# Patient Record
Sex: Female | Born: 1935 | Race: Black or African American | Hispanic: No | Marital: Single | Smoking: Current every day smoker
Health system: Southern US, Community
[De-identification: ages and names within clinical notes are randomized; demographics above are authoritative.]

## PROBLEM LIST (undated history)

## (undated) DIAGNOSIS — F03918 Unspecified dementia, unspecified severity, with other behavioral disturbance: Secondary | ICD-10-CM

---

## 2014-12-29 ENCOUNTER — Encounter (HOSPITAL_COMMUNITY): Payer: Self-pay | Admitting: Emergency Medicine

## 2014-12-29 ENCOUNTER — Emergency Department (HOSPITAL_COMMUNITY): Payer: Self-pay

## 2014-12-29 ENCOUNTER — Emergency Department (HOSPITAL_COMMUNITY)
Admission: EM | Admit: 2014-12-29 | Discharge: 2014-12-31 | Payer: Self-pay | Attending: Emergency Medicine | Admitting: Emergency Medicine

## 2014-12-29 DIAGNOSIS — R4182 Altered mental status, unspecified: Secondary | ICD-10-CM | POA: Insufficient documentation

## 2014-12-29 DIAGNOSIS — F0390 Unspecified dementia without behavioral disturbance: Secondary | ICD-10-CM | POA: Diagnosis present

## 2014-12-29 LAB — COMPREHENSIVE METABOLIC PANEL
ALT: 11 U/L (ref 0–35)
AST: 19 U/L (ref 0–37)
Albumin: 4.2 g/dL (ref 3.5–5.2)
Alkaline Phosphatase: 89 U/L (ref 39–117)
Anion gap: 10 (ref 5–15)
BUN: 12 mg/dL (ref 6–23)
CO2: 25 mmol/L (ref 19–32)
Calcium: 9.5 mg/dL (ref 8.4–10.5)
Chloride: 106 mmol/L (ref 96–112)
Creatinine, Ser: 0.83 mg/dL (ref 0.50–1.10)
GFR calc Af Amer: 72 mL/min — ABNORMAL LOW (ref >90)
GFR calc non Af Amer: 62 mL/min — ABNORMAL LOW (ref >90)
Glucose, Bld: 78 mg/dL (ref 70–99)
Potassium: 3.3 mmol/L — ABNORMAL LOW (ref 3.5–5.1)
Sodium: 141 mmol/L (ref 135–145)
Total Bilirubin: 0.5 mg/dL (ref 0.3–1.2)
Total Protein: 7.9 g/dL (ref 6.0–8.3)

## 2014-12-29 LAB — CBC
HCT: 42.8 % (ref 36.0–46.0)
Hemoglobin: 13.8 g/dL (ref 12.0–15.0)
MCH: 30 pg (ref 26.0–34.0)
MCHC: 32.2 g/dL (ref 30.0–36.0)
MCV: 93 fL (ref 78.0–100.0)
Platelets: 250 10*3/uL (ref 150–400)
RBC: 4.6 MIL/uL (ref 3.87–5.11)
RDW: 15.9 % — ABNORMAL HIGH (ref 11.5–15.5)
WBC: 3.3 10*3/uL — ABNORMAL LOW (ref 4.0–10.5)

## 2014-12-29 LAB — URINALYSIS, ROUTINE W REFLEX MICROSCOPIC
Bilirubin Urine: NEGATIVE
Glucose, UA: NEGATIVE mg/dL
Hgb urine dipstick: NEGATIVE
Ketones, ur: NEGATIVE mg/dL
Leukocytes, UA: NEGATIVE
Nitrite: NEGATIVE
Protein, ur: NEGATIVE mg/dL
Specific Gravity, Urine: 1.003 — ABNORMAL LOW (ref 1.005–1.030)
Urobilinogen, UA: 0.2 mg/dL (ref 0.0–1.0)
pH: 5.5 (ref 5.0–8.0)

## 2014-12-29 LAB — RAPID URINE DRUG SCREEN, HOSP PERFORMED
Amphetamines: NOT DETECTED
Barbiturates: NOT DETECTED
Benzodiazepines: NOT DETECTED
Cocaine: NOT DETECTED
Opiates: NOT DETECTED
Tetrahydrocannabinol: NOT DETECTED

## 2014-12-29 LAB — ACETAMINOPHEN LEVEL: Acetaminophen (Tylenol), Serum: <10 ug/mL — ABNORMAL LOW (ref 10–30)

## 2014-12-29 LAB — SALICYLATE LEVEL: Salicylate Lvl: <4 mg/dL (ref 2.8–20.0)

## 2014-12-29 LAB — ETHANOL: Alcohol, Ethyl (B): 6 mg/dL (ref 0–9)

## 2014-12-29 MED ORDER — LORAZEPAM 1 MG PO TABS
0.0000 mg | ORAL_TABLET | Freq: Two times a day (BID) | ORAL | Status: DC
  Administered 2014-12-29: 2 mg via ORAL

## 2014-12-29 MED ORDER — ACETAMINOPHEN 325 MG PO TABS: 650.0000 mg | ORAL_TABLET | ORAL | Status: DC | PRN

## 2014-12-29 MED ORDER — IBUPROFEN 200 MG PO TABS: 600.0000 mg | ORAL_TABLET | Freq: Three times a day (TID) | ORAL | Status: DC | PRN

## 2014-12-29 MED ORDER — LORAZEPAM 1 MG PO TABS
0.0000 mg | ORAL_TABLET | Freq: Four times a day (QID) | ORAL | Status: DC
  Administered 2014-12-31: 1 mg via ORAL
  Filled 2014-12-29: qty 2
  Filled 2014-12-29: qty 1

## 2014-12-29 NOTE — ED Notes (Signed)
Unable to get bllod at this time incorrect name and birth date in computer

## 2014-12-29 NOTE — ED Notes (Signed)
Patient medicated with Ativan per CIWA scale.  She repeatedly says she cannot go to sleep, and that she doesn't want to.  Anxiety level is increasing.  She says she's having a cramp in her foot that usually goes away after a beer.  Sitter remains at bedside.

## 2014-12-29 NOTE — ED Notes (Signed)
Pt reports that she drinks beer everyday but when asked how much she drinks, pt replied that she "drinks a lot". Pt was unsure about how much she actually drinks. When asked how she gets her alcohol she reported that she sends other people to get it, her husband gets it, and she sometimes goes and gets it herself. Reports that she stays home most days and prepares dinner, drinks her beer, and watches tv while her husband works all day.
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
## 2014-12-29 NOTE — ED Notes (Signed)
Patient and belongings have been wanded by security 
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
## 2014-12-29 NOTE — ED Provider Notes (Signed)
CSN: [id number]     Arrival date & time 12/29/14  1127 History   First MD Initiated Contact with Patient 12/29/14 1507     Chief Complaint  Patient presents with  . IVC'd      (Consider location/radiation/quality/duration/timing/severity/associated sxs/prior Treatment) HPI Comments: Patient here under involuntary commitment due to not being safe at home and suspect dementia. According to the IVC paperwork, patient has been wandering and speaking to her dead husband. No prior history of dementia. There was reported history of her becoming aggressive with her family members. She was brought here via [employer]. She denies any of the above issues. States she feels at her baseline. Denies any recent medication changes. Patient is wondering why she is here  The history is provided by the patient. The history is limited by the condition of the patient.    History reviewed. No pertinent past medical history. History reviewed. No pertinent past surgical history. No family history on file. History  Substance Use Topics  . Smoking status: Not on file  . Smokeless tobacco: Not on file  . Alcohol Use: Yes   OB History    No data available     Review of Systems  Unable to perform ROS     Allergies  Review of patient's allergies indicates no known allergies.  Home Medications   Prior to Admission medications   Not on File   BP 154/75 mmHg  Pulse 77  Temp(Src) 98.6 F (37 C) (Oral)  Resp 16  SpO2 100% Physical Exam  Constitutional: She is oriented to person, place, and time. She appears well-developed and well-nourished.  Non-toxic appearance. No distress.  HENT:  Head: Normocephalic and atraumatic.  Eyes: Conjunctivae, EOM and lids are normal. Pupils are equal, round, and reactive to light.  Neck: Normal range of motion. Neck supple. No tracheal deviation present. No thyroid mass present.  Cardiovascular: Normal rate, regular rhythm and normal heart sounds.  Exam reveals no gallop.    No murmur heard. Pulmonary/Chest: Effort normal and breath sounds normal. No stridor. No respiratory distress. She has no decreased breath sounds. She has no wheezes. She has no rhonchi. She has no rales.  Abdominal: Soft. Normal appearance and bowel sounds are normal. She exhibits no distension. There is no tenderness. There is no rebound and no CVA tenderness.  Musculoskeletal: Normal range of motion. She exhibits no edema or tenderness.  Neurological: She is alert and oriented to person, place, and time. She has normal strength. No cranial nerve deficit or sensory deficit. GCS eye subscore is 4. GCS verbal subscore is 5. GCS motor subscore is 6.  Skin: Skin is warm and dry. No abrasion and no rash noted.  Psychiatric: Her speech is normal. She is not agitated. Thought content is delusional. She exhibits a depressed mood. She expresses no suicidal plans and no homicidal plans.  Nursing note and vitals reviewed.   ED Course  Procedures (including critical care time) Labs Review Labs Reviewed  ACETAMINOPHEN LEVEL - Abnormal; Notable for the following:    Acetaminophen (Tylenol), Serum <10.0 (*)    All other components within normal limits  CBC - Abnormal; Notable for the following:    WBC 3.3 (*)    RDW 15.9 (*)    All other components within normal limits  COMPREHENSIVE METABOLIC PANEL - Abnormal; Notable for the following:    Potassium 3.3 (*)    GFR calc non Af Amer 62 (*)    GFR calc Af Amer 72 (*)  All other components within normal limits  URINALYSIS, ROUTINE W REFLEX MICROSCOPIC - Abnormal; Notable for the following:    APPearance CLOUDY (*)    Specific Gravity, Urine 1.003 (*)    All other components within normal limits  ETHANOL  SALICYLATE LEVEL  URINE RAPID DRUG SCREEN (HOSP PERFORMED)    Imaging Review No results found.   EKG Interpretation None      MDM   Final diagnoses:  None   Patient medically cleared and will be placed per psychiatry  service    Lorre NickAnthony Amogh Komatsu, MD 01/01/15 [id number]

## 2014-12-30 ENCOUNTER — Emergency Department (HOSPITAL_COMMUNITY): Payer: Self-pay

## 2014-12-30 DIAGNOSIS — F0390 Unspecified dementia without behavioral disturbance: Secondary | ICD-10-CM | POA: Diagnosis present

## 2014-12-30 NOTE — ED Notes (Signed)
Pt states she stays with her husband at home.  When pt asked her age, pt states that she is 81 yo.Pt states that she usually buys a 12 pack of beer and drinks about 2 cans a night when she wants to sleep, but states she does not drink every night. Pt states if 2 cans don't help her to sleep she drinks another can and sometimes she has to sneak in the bathroom and drink so that her husband does not see her.

## 2014-12-30 NOTE — Progress Notes (Addendum)
[facility]  [facility]  [place]   [facility]  [place]  [facility]

## 2014-12-30 NOTE — Progress Notes (Signed)
CSW referred the pt to the following hospital to try and obtain placement for hospitalization :  [facility] [address]  Airis Barbee Esmond CamperWhitaker, LCSWA [phone] ED CSW 12/30/2014 10:48 PM
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
## 2014-12-30 NOTE — ED Notes (Signed)
Psych MD/NP at bedside. 

## 2014-12-30 NOTE — Progress Notes (Signed)
Pt observed sleeping throughout the day.  Pt finally awoke and pleasant Eating her lunch.  Pt informed Cm she recalled being seeing by Dr Shanna CiscoA Blount and a "white guy here but I don't know his name"  ([facility] Pt spoke with Cm about her family, she is baby of the family and her dancing.  Pt states she still tap dances

## 2014-12-31 DIAGNOSIS — F0390 Unspecified dementia without behavioral disturbance: Secondary | ICD-10-CM

## 2014-12-31 NOTE — Progress Notes (Signed)
[facility] Dr[facility] Lowanda FosterBeverly Jones. Nurse to call reprot to [phone]. CSW left message with admitting to confirm time of transfer and receiving of the EKG as requested. Pt under IVC, and waiting to be served papers.   Olga CoasterKristen Demauri Advincula, LCSW  Clinical Social Work  [facility] [phone]
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
## 2014-12-31 NOTE — ED Notes (Signed)
Chest x-ray and EKG faxed to [facility], per request of Marcus.

## 2014-12-31 NOTE — BH Assessment (Signed)
[facility] Assessment Progress Note   Clinician received a call from DoyleJennifer at [facility].  Dr. Lowanda FosterBeverly Jones has accepted patient for admission.  Nurse call report number is [phone].  Victorino DikeJennifer did request a copy of the EKG.  Clinician asked Carollee HerterShannon, RN if she would please fax it over, she said she would.  Patient needs IVC paperwork to be reviewed.

## 2020-06-17 ENCOUNTER — Emergency Department (HOSPITAL_COMMUNITY): Payer: Medicare Other

## 2020-06-17 ENCOUNTER — Emergency Department (HOSPITAL_COMMUNITY)
Admission: EM | Admit: 2020-06-17 | Discharge: 2020-06-18 | Disposition: A | Discharge status: Home / Self Care | Payer: Medicare Other | Attending: Emergency Medicine | Admitting: Emergency Medicine

## 2020-06-17 ENCOUNTER — Other Ambulatory Visit: Payer: Self-pay

## 2020-06-17 DIAGNOSIS — W19XXXA Unspecified fall, initial encounter: Secondary | ICD-10-CM | POA: Diagnosis not present

## 2020-06-17 DIAGNOSIS — Y929 Unspecified place or not applicable: Secondary | ICD-10-CM | POA: Diagnosis not present

## 2020-06-17 DIAGNOSIS — S0101XA Laceration without foreign body of scalp, initial encounter: Secondary | ICD-10-CM | POA: Diagnosis present

## 2020-06-17 DIAGNOSIS — F0390 Unspecified dementia without behavioral disturbance: Secondary | ICD-10-CM | POA: Diagnosis not present

## 2020-06-17 DIAGNOSIS — Y939 Activity, unspecified: Secondary | ICD-10-CM | POA: Diagnosis not present

## 2020-06-17 DIAGNOSIS — Y999 Unspecified external cause status: Secondary | ICD-10-CM | POA: Insufficient documentation

## 2020-06-17 NOTE — Discharge Instructions (Addendum)
[facility] out in 7 to 10 days 

## 2020-06-17 NOTE — ED Provider Notes (Signed)
[facility]-EMERGENCY DEPT Provider Note   CSN: [id number] Arrival date & time: 06/17/20  2008     History No chief complaint on file.   Lacey Rogers is a 86 y.o. female.  86 year old female with history dementia here after unwitnessed fall.  Is found to have a small laceration to her occiput.  She apparently is at her mental baseline.  She herself complains of no pain at this time.  Transported by EMS for further management        No past medical history on file.  Patient Active Problem List   Diagnosis Date Noted  . Dementia (HCC) 12/30/2014    No past surgical history on file.   OB History   No obstetric history on file.     No family history on file.  Social History   Tobacco Use  . Smoking status: Not on file  Substance Use Topics  . Alcohol use: Yes  . Drug use: Not on file    Home Medications Prior to Admission medications   Not on File    Allergies    Patient has no known allergies.  Review of Systems   Review of Systems  Unable to perform ROS: Dementia    Physical Exam Updated Vital Signs BP 129/66 (BP Location: Right Arm)   Pulse 65   Temp 98.9 F (37.2 C) (Oral)   Resp 16   Ht 1.575 m (5\' 2" )   Wt 59 kg   SpO2 100%   BMI 23.78 kg/m   Physical Exam Vitals and nursing note reviewed.  Constitutional:      General: She is not in acute distress.    Appearance: Normal appearance. She is well-developed. She is not toxic-appearing.  HENT:     Head: Normocephalic and atraumatic.  Eyes:     General: Lids are normal.     Conjunctiva/sclera: Conjunctivae normal.     Pupils: Pupils are equal, round, and reactive to light.  Neck:     Thyroid: No thyroid mass.     Trachea: No tracheal deviation.  Cardiovascular:     Rate and Rhythm: Normal rate and regular rhythm.     Heart sounds: Normal heart sounds. No murmur heard.  No gallop.   Pulmonary:     Effort: Pulmonary effort is normal. No respiratory distress.      Breath sounds: Normal breath sounds. No stridor. No decreased breath sounds, wheezing, rhonchi or rales.  Abdominal:     General: Bowel sounds are normal. There is no distension.     Palpations: Abdomen is soft.     Tenderness: There is no abdominal tenderness. There is no rebound.  Musculoskeletal:        General: No tenderness. Normal range of motion.     Cervical back: Normal range of motion and neck supple.  Skin:    General: Skin is warm and dry.     Findings: No abrasion or rash.  Neurological:     General: No focal deficit present.     Mental Status: She is alert.     GCS: GCS eye subscore is 4. GCS verbal subscore is 5. GCS motor subscore is 6.     Cranial Nerves: No cranial nerve deficit.     Sensory: Sensation is intact. No sensory deficit.     Motor: Motor function is intact.  Psychiatric:        Attention and Perception: Attention normal.        Speech: Speech normal.  ED Results / Procedures / Treatments   Labs (all labs ordered are listed, but only abnormal results are displayed) Labs Reviewed - No data to display  EKG None  Radiology No results found.  Procedures Procedures (including critical care time)  Medications Ordered in ED Medications - No data to display  ED Course  I have reviewed the triage vital signs and the nursing notes.  Pertinent labs & imaging results that were available during my care of the patient were reviewed by me and considered in my medical decision making (see chart for details).    MDM Rules/Calculators/A&P                          LACERATION REPAIR Performed by: Toy Baker Authorized by: Toy Baker Consent: Verbal consent obtained. Risks and benefits: risks, benefits and alternatives were discussed Consent given by: patient Patient identity confirmed: provided demographic data Prepped and Draped in normal sterile fashion Wound explored  Laceration Location: Scalp  Laceration Length: 1 cm  No  Foreign Bodies seen or palpated  Anesthesia: local infiltration  Local anesthetic:     Irrigation method: syringe Amount of cleaning: standard  Skin closure: Staples  3 staples  Technique: Simple  Patient tolerance: Patient tolerated the procedure well with no immediate complications.  Head and C-spine CT negative.  Patient stable for return to her facility Final Clinical Impression(s) / ED Diagnoses Final diagnoses:  None    Rx / DC Orders ED Discharge Orders    None       Lorre Nick, MD 06/17/20 2238

## 2020-06-17 NOTE — ED Triage Notes (Signed)
Patient here via EMS from [facility], SNF, with c/o finding the patient sitting on the floor, fell backwards and hit here head.  C/O headache, 1" lac to the back of the head, bleeding controlled, occasionally has falls from an unsteady gait.  No blood thinners, wound is dressed with gauze, Hx of Dementia.  118/70,60,98% on ra.  DNR

## 2020-12-06 ENCOUNTER — Emergency Department (HOSPITAL_COMMUNITY)
Admission: EM | Admit: 2020-12-06 | Discharge: 2020-12-06 | Disposition: A | Discharge status: Home / Self Care | Payer: Medicare Other | Attending: Emergency Medicine | Admitting: Emergency Medicine

## 2020-12-06 ENCOUNTER — Other Ambulatory Visit: Payer: Self-pay

## 2020-12-06 ENCOUNTER — Emergency Department (HOSPITAL_COMMUNITY): Payer: Medicare Other

## 2020-12-06 ENCOUNTER — Encounter (HOSPITAL_COMMUNITY): Payer: Self-pay | Admitting: Emergency Medicine

## 2020-12-06 DIAGNOSIS — M79605 Pain in left leg: Secondary | ICD-10-CM | POA: Diagnosis not present

## 2020-12-06 DIAGNOSIS — F17200 Nicotine dependence, unspecified, uncomplicated: Secondary | ICD-10-CM | POA: Diagnosis not present

## 2020-12-06 DIAGNOSIS — F0390 Unspecified dementia without behavioral disturbance: Secondary | ICD-10-CM | POA: Diagnosis not present

## 2020-12-06 DIAGNOSIS — R52 Pain, unspecified: Secondary | ICD-10-CM

## 2020-12-06 MED ORDER — ACETAMINOPHEN 325 MG PO TABS
650.0000 mg | ORAL_TABLET | Freq: Once | ORAL | Status: AC
  Administered 2020-12-06: 650 mg via ORAL
  Filled 2020-12-06: qty 2

## 2020-12-06 NOTE — ED Triage Notes (Signed)
Pt arrived via EMS from [facility]. Pt has hx of dementia. Pt reports that she woke up with leg pain. Pt has swelling and pain to the left knee. Pt reports pain on palpation to her left foot.

## 2020-12-06 NOTE — Discharge Instructions (Addendum)
Today's evaluation has been reassuring. There is no evidence for a fracture or broken bone in your hip, knee, ankle.  If you develop new, or concerning changes return here for additional evaluation.  Please use Tylenol, diclofenac cream for pain control.

## 2020-12-06 NOTE — ED Notes (Signed)
[facility] called for transport.  

## 2020-12-06 NOTE — ED Provider Notes (Signed)
[facility]-EMERGENCY DEPT Provider Note   CSN: [id number] Arrival date & time: 12/06/20  2117     History Chief Complaint  Patient presents with  . Leg Pain    Lacey Rogers is a 87 y.o. female.  HPI Patient presents for left leg pain.  She arrives via EMS from a local memory care facility. History is obtained by the EMS providers.  The patient herself answers some questions directly others without consistency.  Seemingly the patient is typically ambulatory, active.  Today she complained of pain in her left leg.  No report of fall, trauma, and she was upright when this was discovered. Seemingly the pain was initially in the knee, and EMS providers the patient was hesitant to move the knee secondary to pain on transport.  The patient herself when asked where it hurts indicates that it is her ankle.  No report of fever, change in interactivity, or other new developments. Level five caveat secondary to dementia.     History reviewed. No pertinent past medical history.  Patient Active Problem List   Diagnosis Date Noted  . Dementia (HCC) 12/30/2014    History reviewed. No pertinent surgical history.   OB History   No obstetric history on file.     History reviewed. No pertinent family history.  Social History   Tobacco Use  . Smoking status: Current Every Day Smoker  . Smokeless tobacco: Never Used  Substance Use Topics  . Alcohol use: Yes    Home Medications Prior to Admission medications   Not on File    Allergies    Patient has no known allergies.  Review of Systems   Review of Systems  Unable to perform ROS: Dementia    Physical Exam Updated Vital Signs BP 112/79   Pulse 68   Temp 99.2 F (37.3 C) (Oral)   Resp 18   Ht 5\' 2"  (1.575 m)   Wt 54.4 kg   SpO2 99%   BMI 21.95 kg/m   Physical Exam Vitals and nursing note reviewed.  Constitutional:      General: She is not in acute distress.    Appearance: She is  well-developed and well-nourished.  HENT:     Head: Normocephalic and atraumatic.  Eyes:     Extraocular Movements: EOM normal.     Conjunctiva/sclera: Conjunctivae normal.  Cardiovascular:     Rate and Rhythm: Normal rate and regular rhythm.     Pulses: Normal pulses.  Pulmonary:     Effort: Pulmonary effort is normal. No respiratory distress.     Breath sounds: Normal breath sounds. No stridor.  Abdominal:     General: There is no distension.  Musculoskeletal:        General: No edema.       Legs:  Skin:    General: Skin is warm and dry.  Neurological:     Mental Status: She is alert and oriented to person, place, and time.     Cranial Nerves: No cranial nerve deficit.     Motor: Atrophy present.  Psychiatric:        Mood and Affect: Mood and affect normal.        Cognition and Memory: Cognition is impaired. Memory is impaired.     ED Results / Procedures / Treatments   Labs (all labs ordered are listed, but only abnormal results are displayed) Labs Reviewed - No data to display  EKG None  Radiology DG Ankle Complete Left  Result  Date: 12/06/2020 CLINICAL DATA:  Pain EXAM: LEFT ANKLE COMPLETE - 3+ VIEW COMPARISON:  None. FINDINGS: Moderate degenerative changes are noted of the ankle mortise. No acute displaced fracture. Vascular calcifications are noted. IMPRESSION: 1. No acute displaced fracture or dislocation. 2. Moderate degenerative changes. Electronically Signed   By: Katherine Mantle M.D.   On: 12/06/2020 22:22   DG Knee Complete 4 Views Left  Result Date: 12/06/2020 CLINICAL DATA:  Pain EXAM: LEFT KNEE - COMPLETE 4+ VIEW COMPARISON:  None. FINDINGS: There are advanced degenerative changes of the left knee, greatest within the lateral compartment. There is no acute fracture. There is osteopenia. Vascular calcifications are noted. IMPRESSION: 1. No acute osseous abnormality. 2. Advanced degenerative changes of the left knee. Electronically Signed   By:  Katherine Mantle M.D.   On: 12/06/2020 22:20   DG Hip Unilat With Pelvis 2-3 Views Left  Result Date: 12/06/2020 CLINICAL DATA:  Pain EXAM: DG HIP (WITH OR WITHOUT PELVIS) 2-3V LEFT COMPARISON:  None. FINDINGS: There are advanced degenerative changes of both hips, right greater than left. There are advanced degenerative changes of the lower lumbar spine. There is no acute displaced fracture. No dislocation. IMPRESSION: Advanced degenerative changes of both hips, right greater than left. Electronically Signed   By: Katherine Mantle M.D.   On: 12/06/2020 22:20    Procedures Procedures   Medications Ordered in ED Medications  acetaminophen (TYLENOL) tablet 650 mg (650 mg Oral Given 12/06/20 2137)    ED Course  I have reviewed the triage vital signs and the nursing notes.  Pertinent labs & imaging results that were available during my care of the patient were reviewed by me and considered in my medical decision making (see chart for details).  10:54 PM Patient sitting up, moving her legs freely, does not recall complaining of pain.  I reviewed the x-rays, no notable abnormalities, substantial degenerative changes. No current complaints of pain, no report of fall or trauma, low suspicion for occult fracture, though this is admittedly a possibility as the patient is demented. Patient is appropriate for return to her nursing facility with initiation of analgesics. Final Clinical Impression(s) / ED Diagnoses Final diagnoses:  Pain     Gerhard Munch, MD 12/06/20 2255

## 2021-10-31 ENCOUNTER — Emergency Department (HOSPITAL_COMMUNITY)
Admission: EM | Admit: 2021-10-31 | Discharge: 2021-10-31 | Disposition: A | Discharge status: Home / Self Care | Payer: Medicare Other | Attending: Emergency Medicine | Admitting: Emergency Medicine

## 2021-10-31 ENCOUNTER — Emergency Department (HOSPITAL_COMMUNITY): Payer: Medicare Other

## 2021-10-31 ENCOUNTER — Other Ambulatory Visit: Payer: Self-pay

## 2021-10-31 DIAGNOSIS — S0101XA Laceration without foreign body of scalp, initial encounter: Secondary | ICD-10-CM | POA: Diagnosis not present

## 2021-10-31 DIAGNOSIS — Z23 Encounter for immunization: Secondary | ICD-10-CM | POA: Diagnosis not present

## 2021-10-31 DIAGNOSIS — S0990XA Unspecified injury of head, initial encounter: Secondary | ICD-10-CM | POA: Diagnosis present

## 2021-10-31 DIAGNOSIS — I517 Cardiomegaly: Secondary | ICD-10-CM | POA: Diagnosis not present

## 2021-10-31 DIAGNOSIS — F0390 Unspecified dementia without behavioral disturbance: Secondary | ICD-10-CM | POA: Insufficient documentation

## 2021-10-31 DIAGNOSIS — W1839XA Other fall on same level, initial encounter: Secondary | ICD-10-CM | POA: Insufficient documentation

## 2021-10-31 DIAGNOSIS — W19XXXA Unspecified fall, initial encounter: Secondary | ICD-10-CM

## 2021-10-31 LAB — CBC WITH DIFFERENTIAL/PLATELET
Abs Immature Granulocytes: 0.03 10*3/uL (ref 0.00–0.07)
Basophils Absolute: 0 10*3/uL (ref 0.0–0.1)
Basophils Relative: 0 %
Eosinophils Absolute: 0.2 10*3/uL (ref 0.0–0.5)
Eosinophils Relative: 3 %
HCT: 39.6 % (ref 36.0–46.0)
Hemoglobin: 12.8 g/dL (ref 12.0–15.0)
Immature Granulocytes: 0 %
Lymphocytes Relative: 26 %
Lymphs Abs: 1.8 10*3/uL (ref 0.7–4.0)
MCH: 29.5 pg (ref 26.0–34.0)
MCHC: 32.3 g/dL (ref 30.0–36.0)
MCV: 91.2 fL (ref 80.0–100.0)
Monocytes Absolute: 0.5 10*3/uL (ref 0.1–1.0)
Monocytes Relative: 7 %
Neutro Abs: 4.3 10*3/uL (ref 1.7–7.7)
Neutrophils Relative %: 64 %
Platelets: 244 10*3/uL (ref 150–400)
RBC: 4.34 MIL/uL (ref 3.87–5.11)
RDW: 14.6 % (ref 11.5–15.5)
WBC: 6.9 10*3/uL (ref 4.0–10.5)
nRBC: 0 % (ref 0.0–0.2)

## 2021-10-31 LAB — BASIC METABOLIC PANEL
Anion gap: 9 (ref 5–15)
BUN: 18 mg/dL (ref 8–23)
CO2: 29 mmol/L (ref 22–32)
Calcium: 9.8 mg/dL (ref 8.9–10.3)
Chloride: 101 mmol/L (ref 98–111)
Creatinine, Ser: 0.89 mg/dL (ref 0.44–1.00)
GFR, Estimated: >60 mL/min (ref >60)
Glucose, Bld: 93 mg/dL (ref 70–99)
Potassium: 3.9 mmol/L (ref 3.5–5.1)
Sodium: 139 mmol/L (ref 135–145)

## 2021-10-31 MED ORDER — TETANUS-DIPHTH-ACELL PERTUSSIS 5-2.5-18.5 LF-MCG/0.5 IM SUSY
0.5000 mL | PREFILLED_SYRINGE | Freq: Once | INTRAMUSCULAR | Status: AC
  Administered 2021-10-31: 0.5 mL via INTRAMUSCULAR
  Filled 2021-10-31: qty 0.5

## 2021-10-31 MED ORDER — LIDOCAINE-EPINEPHRINE (PF) 2 %-1:200000 IJ SOLN
20.0000 mL | Freq: Once | INTRAMUSCULAR | Status: AC
  Administered 2021-10-31: 20 mL
  Filled 2021-10-31: qty 20

## 2021-10-31 NOTE — ED Notes (Signed)
Patient transported to CT 

## 2021-10-31 NOTE — ED Notes (Signed)
Mittens placed on pt as she continued to take cords off.

## 2021-10-31 NOTE — Discharge Instructions (Signed)
You had a fall and had a large laceration to the back of your scalp.  I placed 6 staples that should be removed in around 7 days.  You can have this done at your skilled nursing facility, or return to an urgent care or the emergency room for removal.  As you had a head injury I recommend rest, ibuprofen, Tylenol for pain, plenty of fluids.

## 2021-10-31 NOTE — ED Triage Notes (Signed)
Fall unwitnessed sometime in lat 6 hours, morning staff arrived found her sitting on the floor.  Laceration posterior scalp, bleeding controlled, no blood thinners, unkown LOC, reports neck/back pain, c-collar in place on arrrival.  Hx advanced dementia,

## 2021-10-31 NOTE — ED Notes (Addendum)
Call to [facility] to provide report, spoke to [place], [place] contacted for transport back to facility.  Pt provided meal bag and fluids.

## 2021-10-31 NOTE — ED Notes (Signed)
C-collar removed by provider

## 2021-10-31 NOTE — ED Provider Notes (Signed)
[facility] EMERGENCY DEPARTMENT Provider Note   CSN: [id number] Arrival date & time: 10/31/21  0844     History  Chief Complaint  Patient presents with   Lacey Rogers is a 88 y.o. female who presents from [facility] with concern for unwitnessed fall.  She has a notable approximately 3 to 4 cm laceration on her occiput.  She is not known to take blood thinners.  Patient has advanced dementia at baseline, can be agitated, combative.  She responds to questions with some nodding, but gives no verbal response on my initial examination.  Per EMS she was minimally verbal with them.   Level 5 caveat: Dementia  Fall      Home Medications Prior to Admission medications   Not on File      Allergies    Patient has no known allergies.    Review of Systems   Review of Systems  Reason unable to perform ROS: dementia.   Physical Exam Updated Vital Signs BP (!) 161/92    Pulse 71    Temp 98.1 F (36.7 C) (Oral)    Resp 12    SpO2 100%  Physical Exam Vitals and nursing note reviewed.  Constitutional:      General: She is not in acute distress.    Appearance: Normal appearance.     Comments: Very thin elderly female in no acute distress  HENT:     Head: Normocephalic and atraumatic.  Eyes:     General:        Right eye: No discharge.        Left eye: No discharge.  Cardiovascular:     Rate and Rhythm: Normal rate and regular rhythm.     Heart sounds: No murmur heard.   No friction rub. No gallop.  Pulmonary:     Effort: Pulmonary effort is normal.     Breath sounds: Normal breath sounds.  Abdominal:     General: Bowel sounds are normal.     Palpations: Abdomen is soft.  Musculoskeletal:     Comments: Full traumatic examination was performed, patient denies tenderness to any of her extremities, trunk other than her neck, head.  She has no step-offs or deformity noted.  She moves all 4 limbs spontaneously.  Skin:    General:  Skin is warm and dry.     Capillary Refill: Capillary refill takes less than 2 seconds.     Comments: Patient with approximately 3 to 4 cm linear laceration on the occiput of the scalp.  Not actively bleeding at time of my exam.  Neurological:     Mental Status: She is alert and oriented to person, place, and time.     Comments: Difficult to perform a full neuro exam as patient is minimally responsive to interview, however her pupils are equal round reactive to light, she has conjugate gaze, she can move all 4 limbs spontaneously.  She has no evidence of facial droop.  She localizes to sound, she can answer simple questions with nodding  Psychiatric:        Mood and Affect: Mood normal.        Behavior: Behavior normal.    ED Results / Procedures / Treatments   Labs (all labs ordered are listed, but only abnormal results are displayed) Labs Reviewed  CBC WITH DIFFERENTIAL/PLATELET  BASIC METABOLIC PANEL    EKG None  Radiology DG Chest 2 View  Result Date: 10/31/2021  CLINICAL DATA:  Fall EXAM: CHEST - 2 VIEW COMPARISON:  Chest x-ray 12/30/2014 FINDINGS: Heart is enlarged. Tortuous thoracic aorta. Mediastinum appears stable. Pulmonary vasculature is normal. No focal consolidation, pleural effusion or pneumothorax identified. IMPRESSION: Cardiomegaly with no acute process identified. Electronically Signed   By: Ofilia Neas M.D.   On: 10/31/2021 09:42   CT Head Wo Contrast  Result Date: 10/31/2021 CLINICAL DATA:  88 year old female status post unwitnessed fall. Scalp laceration. EXAM: CT HEAD WITHOUT CONTRAST TECHNIQUE: Contiguous axial images were obtained from the base of the skull through the vertex without intravenous contrast. RADIATION DOSE REDUCTION: This exam was performed according to the departmental dose-optimization program which includes automated exposure control, adjustment of the mA and/or kV according to patient size and/or use of iterative reconstruction technique.  COMPARISON:  Head CT 06/17/2020. FINDINGS: Brain: Severe degenerative stenosis at the cervicomedullary junction, see cervical spine CT today reported separately. Stable cerebral volume. Volume loss related extra-axial subarachnoid CSF suspected and stable. No midline shift, ventriculomegaly, mass effect, evidence of mass lesion, intracranial hemorrhage or evidence of cortically based acute infarction. Stable gray-white matter differentiation throughout the brain. Mild to moderate for age chronic white matter hypodensity. Vascular: Calcified atherosclerosis at the skull base. No suspicious intracranial vascular hyperdensity. Skull: Cervical spine detailed separately. No acute osseous abnormality identified. Sinuses/Orbits: Odontogenic appearing maxillary alveolar recess sinus mucosal thickening greater on the left. Other Visualized paranasal sinuses and mastoids are stable and well aerated. Other: Right anterior convexity mild scalp soft tissue injury with no discrete hematoma or tracking soft tissue gas. Orbits soft tissues appears stable. Furthermore, suggestion of mild broad-based occipital convexity scalp hematoma on series 3, image 39. Underlying calvarium appears intact. IMPRESSION: 1. Mild scalp soft tissue injuries without underlying skull fracture. 2. No acute intracranial abnormality, stable non contrast CT appearance of the brain. 3. However, severe degenerative stenosis at the cervicomedullary junction, see Cervical Spine CT today reported separately. Electronically Signed   By: Genevie Ann M.D.   On: 10/31/2021 10:41   CT Cervical Spine Wo Contrast  Result Date: 10/31/2021 CLINICAL DATA:  88 year old female status post unwitnessed fall. Scalp laceration. EXAM: CT CERVICAL SPINE WITHOUT CONTRAST TECHNIQUE: Multidetector CT imaging of the cervical spine was performed without intravenous contrast. Multiplanar CT image reconstructions were also generated. RADIATION DOSE REDUCTION: This exam was performed  according to the departmental dose-optimization program which includes automated exposure control, adjustment of the mA and/or kV according to patient size and/or use of iterative reconstruction technique. COMPARISON:  Head CT today. Cervical spine CT 06/17/2020. FINDINGS: Alignment: Straightening of cervical lordosis. Less reversal of lordosis compared to 2021. Stable cervicothoracic junction alignment. Stable posterior element alignment. Skull base and vertebrae: Visualized skull base is intact. No atlanto-occipital dissociation. C1 and C2 remain aligned, but chronic C1-C2 degeneration has progressed and is now severe on the left side, with bulky subchondral cysts there, complete loss of the joint space, bone-on-bone appearance, and associated bulky degenerative soft tissue and osseous hypertrophy resulting in at least moderate stenosis on the cervicomedullary junction (series 5, image 14). Other cervical vertebrae appear intact. No acute osseous abnormality identified. Soft tissues and spinal canal: Cervicomedullary junction stenosis as above. Additional multilevel degenerative spinal stenosis, although chronic and probably not significantly progressed since 2021. This includes C3-C4 on series 7, image 29. partially retropharyngeal course of the right carotid. Otherwise negative visible noncontrast neck soft tissues. Disc levels: Chronic severe cervical spine degeneration. Severe progression at the left C1-C2 articulation is  detailed above. Progressed degenerative interbody ankylosis at C5-C6 since 2021. cervicomedullary junction and multifactorial cervical spinal stenosis results. Upper chest: Mild T2 inferior endplate compression appears stable. No acute osseous abnormality identified. Negative lung apices. IMPRESSION: 1. No acute traumatic injury identified in the cervical spine. 2. Very severe cervical spine degeneration, progressed at C1-C2 since 2021 with at least moderate stenosis at the cervicomedullary  junction now. Chronic multilevel additional cervical spinal stenosis. Electronically Signed   By: Genevie Ann M.D.   On: 10/31/2021 10:46    Procedures Procedures    Medications Ordered in ED Medications  lidocaine-EPINEPHrine (XYLOCAINE W/EPI) 2 %-1:200000 (PF) injection 20 mL (20 mLs Infiltration Given by Other 10/31/21 1102)  Tdap (BOOSTRIX) injection 0.5 mL (0.5 mLs Intramuscular Given 10/31/21 1101)    ED Course/ Medical Decision Making/ A&P Clinical Course as of 10/31/21 1136  Sun Oct 31, 2021  0944 This patient arrives from nursing home with concern for unwitnessed fall.  She has a notable approximately 3 to 4 cm laceration on her occiput.  She is not known to take blood thinners.  Patient has advanced dementia at baseline, can be agitated, combative.  She responds to questions with some nodding, but gives no verbal response.  Per EMS she was minimally verbal with them. [CP]  [id number] Typically walks without assistance, confused, combative, but always verbal, often with cursing. [CP]    Clinical Course User Index [CP] Cuma Polyakov, Joesph Fillers, PA-C                           Medical Decision Making Amount and/or Complexity of Data Reviewed Labs: ordered. Radiology: ordered.  I discussed this case with my attending physician who cosigned this note including patient's presenting symptoms, physical exam, and planned diagnostics and interventions. Attending physician stated agreement with plan or made changes to plan which were implemented.   Attending physician assessed patient at bedside.  This is a 88 year old female who is demented at baseline who presents after presumed fall last night, it was unwitnessed, unclear loss of consciousness.  She has a laceration on the back of her head.  On my full trauma examination patient has no other signs of injury other than laceration on the back of her head, is complaining of some head and neck pain.  On initial evaluation patient is poorly responsive,  somewhat disoriented even from baseline.  On reevaluation after head and neck CT are clear patient is verbal, responsive, however is continuing to be confused, with normal sequelae of advanced dementia.  She is pleasant at this time.  She has no dysarthria, she has conjugate gaze.  I repaired her laceration on her scalp as described above.  I personally ordered and reviewed lab work including CBC, BMP which are remarkable for no abnormalities at this time.  I personally ordered and reviewed CT head without contrast, CT cervical spine which are remarkable for no acute intracranial injury, with progressive degenerative changes of the cervical spine, but no evidence of acute injury.  I agree with the radiologist interpretation.  This patient is back to her baseline at this time, she appears stable for discharge.  She has maintained a normal sinus rhythm with premature beats on the monitor for her duration.  She has no other signs of injury or trauma.  Her blood work is unremarkable.  Multiple unsuccessful attempts were made to contact her niece, which is her only contact on file. Final Clinical Impression(s) / ED Diagnoses  Final diagnoses:  Fall, initial encounter  Laceration of scalp, initial encounter  Injury of head, initial encounter    Rx / DC Orders ED Discharge Orders     None         Dorien Chihuahua 10/31/21 1136    Davonna Belling, MD 10/31/21 1525

## 2021-11-07 ENCOUNTER — Emergency Department (HOSPITAL_COMMUNITY)
Admission: EM | Admit: 2021-11-07 | Discharge: 2021-11-08 | Disposition: A | Discharge status: Home / Self Care | Payer: Medicare Other | Attending: Emergency Medicine | Admitting: Emergency Medicine

## 2021-11-07 ENCOUNTER — Emergency Department (HOSPITAL_COMMUNITY): Payer: Medicare Other

## 2021-11-07 DIAGNOSIS — F0390 Unspecified dementia without behavioral disturbance: Secondary | ICD-10-CM | POA: Insufficient documentation

## 2021-11-07 DIAGNOSIS — Z4802 Encounter for removal of sutures: Secondary | ICD-10-CM | POA: Diagnosis not present

## 2021-11-07 DIAGNOSIS — R519 Headache, unspecified: Secondary | ICD-10-CM | POA: Diagnosis not present

## 2021-11-07 NOTE — Discharge Instructions (Signed)
Return for any problem.  ?

## 2021-11-07 NOTE — ED Notes (Signed)
Staples removed from head by EDP at bedside. Pt tolerated well.

## 2021-11-07 NOTE — ED Triage Notes (Addendum)
Pt c/o headache and got tylenol by facility MD. Pt coming from [facility]. They wanted her sent out for head CT due to headache. They stated she was crying due to pain. Currently has no complains or thoughts. Needs staples removed that were placed on 1/22. PT has sundowners and dementia. VSS and when asked what is wrong, pt will sometimes touch back of her head.

## 2021-11-07 NOTE — ED Provider Notes (Signed)
[facility]-EMERGENCY DEPT Provider Note   CSN: [id number] Arrival date & time: 11/07/21  2001     History  Chief Complaint  Patient presents with   Headache    Lacey Rogers is a 88 y.o. female.  88 year old female arrives by EMS from [facility].  Staff sent her for head CT.  Patient apparently complained of headache earlier today.  She currently is without complaint.  She does have a history of dementia.  Staff also requested that staples should be removed from the posterior scalp.  Staples were placed on January 22, 7 days prior.  The history is provided by the patient, medical records and the EMS personnel.      Home Medications Prior to Admission medications   Not on File      Allergies    Patient has no known allergies.    Review of Systems   Review of Systems  Unable to perform ROS: Dementia   Physical Exam Updated Vital Signs Temp (!) 97.5 F (36.4 C) (Oral)  Physical Exam Vitals and nursing note reviewed.  Constitutional:      General: She is not in acute distress.    Appearance: Normal appearance. She is well-developed.  HENT:     Head: Normocephalic and atraumatic.  Eyes:     Conjunctiva/sclera: Conjunctivae normal.     Pupils: Pupils are equal, round, and reactive to light.  Cardiovascular:     Rate and Rhythm: Normal rate and regular rhythm.     Heart sounds: Normal heart sounds.  Pulmonary:     Effort: Pulmonary effort is normal. No respiratory distress.     Breath sounds: Normal breath sounds.  Abdominal:     General: There is no distension.     Palpations: Abdomen is soft.     Tenderness: There is no abdominal tenderness.  Musculoskeletal:        General: No deformity. Normal range of motion.     Cervical back: Normal range of motion and neck supple.  Skin:    General: Skin is warm and dry.     Comments: Staples and posterior scalp are in place.  Underlying wound is well-healed.  Neurological:     General: No  focal deficit present.     Mental Status: She is alert. Mental status is at baseline.    ED Results / Procedures / Treatments   Labs (all labs ordered are listed, but only abnormal results are displayed) Labs Reviewed - No data to display  EKG None  Radiology No results found.  Procedures Procedures    Medications Ordered in ED Medications - No data to display  ED Course/ Medical Decision Making/ A&P                           Medical Decision Making Amount and/or Complexity of Data Reviewed Radiology: ordered.    Medical Screen Complete  This patient presented to the ED with complaint of headache (resolved), staple removal.  This complaint involves an extensive number of treatment options. The initial differential diagnosis includes, but is not limited to, staple removal, possible intracranial injury  This presentation is: Acute, Previously Undiagnosed, Uncertain Prognosis, Complicated, Systemic Symptoms, and Threat to Life/Bodily Function  Patient was sent from her facility for evaluation  Patient had complained of headache earlier today.  CT imaging was requested.  Patient without complaint upon arrival  Patient was also sent for removal of staples.  Patient  with staples in her scalp that were placed on January 22.  Wound is well-healed.  Staples were removed without difficulty  CT imaging was without acute pathology.  Patient is appropriate for outpatient management.  Co morbidities that complicated the patient's evaluation  Advanced age, dementia   Additional history obtained:  Additional history obtained from EMS External records from outside sources obtained and reviewed including prior ED visits and prior Inpatient records.    Imaging Studies ordered:  I ordered imaging studies including CT head I independently visualized and interpreted obtained imaging which showed no acute pathology I agree with the radiologist interpretation.   Cardiac  Monitoring:  The patient was maintained on a cardiac monitor.  I personally viewed and interpreted the cardiac monitor which showed an underlying rhythm of: NSR      Problem List / ED Course:  Staple removal   Reevaluation:  After the interventions noted above, I reevaluated the patient and found that they have: stayed the same   Disposition:  After consideration of the diagnostic results and the patients response to treatment, I feel that the patent would benefit from close outpatient follow-up.          Final Clinical Impression(s) / ED Diagnoses Final diagnoses:  Removal of staple  Acute nonintractable headache, unspecified headache type    Rx / DC Orders ED Discharge Orders     None         Wynetta Fines, MD 11/07/21 2141

## 2021-11-07 NOTE — ED Notes (Signed)
Pt stated she was in the hospital when asked. She is not oriented to time but responds to her name and remembered she had the staples in.

## 2022-05-08 ENCOUNTER — Emergency Department (HOSPITAL_COMMUNITY): Payer: Medicare Other

## 2022-05-08 ENCOUNTER — Encounter (HOSPITAL_COMMUNITY): Payer: Self-pay | Admitting: Internal Medicine

## 2022-05-08 ENCOUNTER — Emergency Department (HOSPITAL_COMMUNITY)
Admission: EM | Admit: 2022-05-08 | Discharge: 2022-05-10 | DRG: 871 | Disposition: E | Payer: Medicare Other | Attending: Internal Medicine | Admitting: Internal Medicine

## 2022-05-08 DIAGNOSIS — F17200 Nicotine dependence, unspecified, uncomplicated: Secondary | ICD-10-CM | POA: Diagnosis present

## 2022-05-08 DIAGNOSIS — R6521 Severe sepsis with septic shock: Secondary | ICD-10-CM | POA: Diagnosis not present

## 2022-05-08 DIAGNOSIS — I6782 Cerebral ischemia: Secondary | ICD-10-CM | POA: Insufficient documentation

## 2022-05-08 DIAGNOSIS — R4182 Altered mental status, unspecified: Secondary | ICD-10-CM

## 2022-05-08 DIAGNOSIS — R680 Hypothermia, not associated with low environmental temperature: Secondary | ICD-10-CM | POA: Diagnosis not present

## 2022-05-08 DIAGNOSIS — Z20822 Contact with and (suspected) exposure to covid-19: Secondary | ICD-10-CM | POA: Diagnosis not present

## 2022-05-08 DIAGNOSIS — F209 Schizophrenia, unspecified: Secondary | ICD-10-CM | POA: Diagnosis present

## 2022-05-08 DIAGNOSIS — F03C0 Unspecified dementia, severe, without behavioral disturbance, psychotic disturbance, mood disturbance, and anxiety: Secondary | ICD-10-CM | POA: Diagnosis present

## 2022-05-08 DIAGNOSIS — Z515 Encounter for palliative care: Secondary | ICD-10-CM

## 2022-05-08 DIAGNOSIS — R627 Adult failure to thrive: Secondary | ICD-10-CM | POA: Diagnosis not present

## 2022-05-08 DIAGNOSIS — J690 Pneumonitis due to inhalation of food and vomit: Secondary | ICD-10-CM | POA: Diagnosis not present

## 2022-05-08 DIAGNOSIS — F0390 Unspecified dementia without behavioral disturbance: Secondary | ICD-10-CM | POA: Diagnosis present

## 2022-05-08 DIAGNOSIS — J9600 Acute respiratory failure, unspecified whether with hypoxia or hypercapnia: Secondary | ICD-10-CM

## 2022-05-08 DIAGNOSIS — A419 Sepsis, unspecified organism: Principal | ICD-10-CM | POA: Diagnosis present

## 2022-05-08 DIAGNOSIS — Z66 Do not resuscitate: Secondary | ICD-10-CM | POA: Diagnosis not present

## 2022-05-08 HISTORY — DX: Unspecified dementia, unspecified severity, with other behavioral disturbance: F03.918

## 2022-05-08 LAB — CBG MONITORING, ED: Glucose-Capillary: 123 mg/dL — ABNORMAL HIGH (ref 70–99)

## 2022-05-08 LAB — CBC
HCT: 50.6 % — ABNORMAL HIGH (ref 36.0–46.0)
Hemoglobin: 16.2 g/dL — ABNORMAL HIGH (ref 12.0–15.0)
MCH: 29.7 pg (ref 26.0–34.0)
MCHC: 32 g/dL (ref 30.0–36.0)
MCV: 92.8 fL (ref 80.0–100.0)
Platelets: 156 10*3/uL (ref 150–400)
RBC: 5.45 MIL/uL — ABNORMAL HIGH (ref 3.87–5.11)
RDW: 15.8 % — ABNORMAL HIGH (ref 11.5–15.5)
WBC: 10.3 10*3/uL (ref 4.0–10.5)
nRBC: 8.2 % — ABNORMAL HIGH (ref 0.0–0.2)

## 2022-05-08 LAB — COMPREHENSIVE METABOLIC PANEL
ALT: 36 U/L (ref 0–44)
AST: 30 U/L (ref 15–41)
Albumin: 2.7 g/dL — ABNORMAL LOW (ref 3.5–5.0)
Alkaline Phosphatase: 139 U/L — ABNORMAL HIGH (ref 38–126)
Anion gap: 14 (ref 5–15)
BUN: 29 mg/dL — ABNORMAL HIGH (ref 8–23)
CO2: 20 mmol/L — ABNORMAL LOW (ref 22–32)
Calcium: 10.3 mg/dL (ref 8.9–10.3)
Chloride: 105 mmol/L (ref 98–111)
Creatinine, Ser: 1.63 mg/dL — ABNORMAL HIGH (ref 0.44–1.00)
GFR, Estimated: 29 mL/min — ABNORMAL LOW (ref >60)
Glucose, Bld: 118 mg/dL — ABNORMAL HIGH (ref 70–99)
Potassium: 4.7 mmol/L (ref 3.5–5.1)
Sodium: 139 mmol/L (ref 135–145)
Total Bilirubin: 0.2 mg/dL — ABNORMAL LOW (ref 0.3–1.2)
Total Protein: 7.8 g/dL (ref 6.5–8.1)

## 2022-05-08 LAB — SARS CORONAVIRUS 2 BY RT PCR: SARS Coronavirus 2 by RT PCR: NEGATIVE

## 2022-05-08 LAB — LACTIC ACID, PLASMA: Lactic Acid, Venous: 6.8 mmol/L (ref 0.5–1.9)

## 2022-05-08 MED ORDER — HYDROMORPHONE HCL 1 MG/ML IJ SOLN
0.5000 mg | INTRAMUSCULAR | Status: DC | PRN
  Administered 2022-05-08: 0.5 mg via INTRAVENOUS
  Filled 2022-05-08: qty 1

## 2022-05-08 MED ORDER — GLYCOPYRROLATE 0.2 MG/ML IJ SOLN
0.4000 mg | INTRAMUSCULAR | Status: DC
  Administered 2022-05-08: 0.4 mg via INTRAVENOUS
  Filled 2022-05-08: qty 2

## 2022-05-08 MED ORDER — SODIUM CHLORIDE 0.9 % IV SOLN
1.0000 g | Freq: Once | INTRAVENOUS | Status: DC
  Filled 2022-05-08: qty 10

## 2022-05-08 MED ORDER — SODIUM CHLORIDE 0.9 % IV SOLN: 500.0000 mg | INTRAVENOUS | Status: DC

## 2022-05-10 NOTE — ED Triage Notes (Signed)
Pt BIB [facility]EMS from [facility].d/t Failure to thrive for the past month.  Respiratory distress developed in the past 2-3 days. Pt was placed on 3L O2 in facility 2 days ago. Dementia at baseline. Pt was in apparent respiratory distress upon EMS arrival.   90/60 Cbg 129  HR 90

## 2022-05-10 NOTE — ED Provider Notes (Signed)
[facility] EMERGENCY DEPARTMENT Provider Note   CSN: [id number] Arrival date & time: 05-14-22  1154     History  Chief Complaint  Patient presents with   Failure To Thrive    Lacey Rogers is a 88 y.o. female.  Patient with history of dementia presents from [facility] for evaluation of respiratory distress.  Per EMS report, patient has dementia but typically can interact at baseline.  Over the past 3 days she has been much less responsive.  She has needed supplemental oxygen.  No fevers, cough reported.  Due to her status, staff decided to call EMS for transport to the hospital this morning.       Home Medications Prior to Admission medications   Not on File      Allergies    Patient has no known allergies.    Review of Systems   Review of Systems  Physical Exam Updated Vital Signs BP (!) 89/58   Resp (!) 34   Physical Exam Vitals and nursing note reviewed.  Constitutional:      General: She is not in acute distress.    Appearance: She is well-developed.  HENT:     Head: Normocephalic and atraumatic.     Right Ear: External ear normal.     Left Ear: External ear normal.     Nose: Nose normal.  Eyes:     Conjunctiva/sclera: Conjunctivae normal.  Cardiovascular:     Rate and Rhythm: Normal rate and regular rhythm.     Heart sounds: No murmur heard. Pulmonary:     Effort: Tachypnea present.     Breath sounds: Rhonchi and rales present. No wheezing.  Abdominal:     Palpations: Abdomen is soft.     Tenderness: There is no abdominal tenderness. There is no guarding or rebound.  Musculoskeletal:     Cervical back: Normal range of motion and neck supple.     Right lower leg: No edema.     Left lower leg: No edema.  Skin:    General: Skin is warm and dry.     Capillary Refill: Capillary refill takes more than 3 seconds.     Findings: No rash.     Comments: Extremities cool to touch  Neurological:     Mental Status: She is  unresponsive.  Psychiatric:        Mood and Affect: Mood normal.     ED Results / Procedures / Treatments   Labs (all labs ordered are listed, but only abnormal results are displayed) Labs Reviewed  CBG MONITORING, ED - Abnormal; Notable for the following components:      Result Value   Glucose-Capillary 123 (*)    All other components within normal limits  SARS CORONAVIRUS 2 BY RT PCR  CULTURE, BLOOD (ROUTINE X 2)  CULTURE, BLOOD (ROUTINE X 2)  URINE CULTURE  CBC  COMPREHENSIVE METABOLIC PANEL  LACTIC ACID, PLASMA  LACTIC ACID, PLASMA  URINALYSIS, ROUTINE W REFLEX MICROSCOPIC  BRAIN NATRIURETIC PEPTIDE  I-STAT ARTERIAL BLOOD GAS, ED  TROPONIN I (HIGH SENSITIVITY)    EKG None  Radiology DG Chest 1 View  Result Date: 05-14-22 CLINICAL DATA:  A 88 year old presents with shortness of breath. EXAM: CHEST  1 VIEW COMPARISON:  March of 2016 FINDINGS: EKG leads project over the chest. Cardiomediastinal contours are stable. Hilar structures with increased fullness since previous imaging bilaterally. Indistinct interstitium. No lobar consolidation.  No sign of pneumothorax. On limited assessment there is no acute  skeletal finding. IMPRESSION: Increased fullness of bilateral hila is suggesting central pulmonary vascular congestion perhaps associated with mild edema. Suggest follow-up PA and lateral chest radiograph when the patient is able to ensure resolution of hilar fullness. No sign of lobar consolidation. Electronically Signed   By: Donzetta Kohut M.D.   On: 2022/06/02 13:02    Procedures Procedures    Medications Ordered in ED Medications - No data to display  ED Course/ Medical Decision Making/ A&P    Patient seen and examined. History obtained per EMS report and nursing facility notes. DNR noted. Discussed with and seen by Dr. Lockie Mola on arrival. Suspect patient may be near end-of-life. Attempted to call niece, to appears to be HPOA. Goes to full voicemail.    Labs/EKG: Ordered sepsis labs.  Imaging: Ordered chest x-ray.  Medications/Fluids: IV abx  Most recent vital signs reviewed and are as follows: BP (!) 89/58   Resp (!) 34   Initial impression: Respiratory failure  12:25 PM temperature 94, will place on Bair hugger.  1:42 PM TOC and palliative care has been involved, as no family available to make medical decisions.  Please see notes from [facility] and Dr. Lockie Mola.   Dr. Ophelia Charter with [facility] has seen patient as well.   Reassessment performed. Patient appears ill. Continued tachypnea.   Labs personally reviewed and interpreted including: CT with white blood cell count 10.3, hemoglobin 16.2; blood sugar 123.  Imaging personally visualized and interpreted including: X-ray of the chest, agree no dense infiltrates; CT of the head with atrophy, agree no acute bleeding or stroke.  Most current vital signs reviewed and are as follows: BP (!) 71/61   Pulse 82   Temp (!) 94 F (34.4 C) (Rectal)   Resp (!) 36   SpO2 100%   Plan: Admission, likely transition to comfort measures.   1:58 PM Patient expired, notified by Annamaria Helling RN who was with patient.                           Medical Decision Making Amount and/or Complexity of Data Reviewed Labs: ordered. Radiology: ordered.  Risk Decision regarding hospitalization.   Patient with FTT, respiratory failure, obtundation.         Final Clinical Impression(s) / ED Diagnoses Final diagnoses:  Failure to thrive in adult  Altered mental status, unspecified altered mental status type  Acute respiratory failure, unspecified whether with hypoxia or hypercapnia [name])    Rx / DC Orders ED Discharge Orders     None         Renne Crigler, PA-C 06-02-22 1402    Virgina Norfolk, DO 06-02-22 1405

## 2022-05-10 NOTE — Consult Note (Signed)
Consultation Note Date: 05-27-22   Patient Name: Lacey Rogers  DOB: 09/27/1926  MRN: [id number]  Age / Sex: 88 y.o., female  PCP: Georgann Housekeeper, MD Referring Physician: Jonah Blue, MD  Reason for Consultation: Establishing goals of care and assistance reaching family for decision making  HPI/Patient Profile: 88 y.o. female  with past medical history of severe dementia and schizophrenia admitted on 2022-05-27 with unresponsiveness from [facility].  Patient with possible sepsis and aspiration pneumonia, likely at end-of-life. PMT has been consulted to assist with goals of care conversation.  Clinical Assessment and Goals of Care:  I have reviewed medical records including EPIC notes, labs and imaging, received report from RN, assessed the patient and then called multiple times and sent text message with PMT contact information for niece Gailen Shelter to discuss diagnosis prognosis, GOC, EOL wishes, disposition and options.    Several attempts made by the medical team to reach next of kin but we have been unable to reach patient's only listed contact and unable to leave a message as her voicemail box is not set up.  No other contacts found throughout chart review and Care Everywhere.  [facility] was contacted as well and reports they are unable to give out this information.  The supervisor is also not available at this time.  Code Status: Patient with gold DNR form from facility.  Chart review of care everywhere ([facility]) is also in support of this.  On my assessment at the bedside, patient is profoundly suffering with increased work of breathing and respiratory distress, excessive secretions, and grimacing with signs of pain.  She is actively dying.  Discussed with Dr. Lockie Mola, primary RN, NCM, Dr. Lyn Hollingshead and Dr. Ophelia Charter.    SUMMARY OF RECOMMENDATIONS   -Continue DNR per documentation  provided by facility -Multiple attempts have been made to achieve shared decision-making between the medical team and the patient's family/surrogate decision-maker(s), but shared decision-making is not possible given inability to reach her only listed contact Gailen Shelter (niece) -It is my professional opinion that aggressive treatment is medically futile as this will not improve patient's QOL, will serve no medical benefit, and may in fact cause harm with little to no chance for recovery -I recommend transition to full comfort care and have discussed with Dr. Lockie Mola and Dr. Ophelia Charter -Discussed with Dr. Linna Darner as well, will order PRN dilaudid for symptom management while other treatments continue given her uncontrolled dyspnea  -Scheduled Robinul Q4H for excessive secretions -PMT will continue to follow and support   Prognosis:  Hours - Days  Discharge Planning: Anticipated Hospital Death      Primary Diagnoses: Present on Admission:  Sepsis due to undetermined organism [facility])   Physical Exam Vitals and nursing note reviewed.  Constitutional:      General: She is in acute distress.     Appearance: She is cachectic. She is ill-appearing.     Comments: Audible secretions with respirations  Cardiovascular:     Rate and Rhythm: Normal rate.  Pulmonary:     Effort: Tachypnea, accessory muscle usage and respiratory distress present.  Skin:    General: Skin is cool.  Neurological:     Mental Status: She is unresponsive.     Vital Signs: BP (!) 71/61   Pulse 82   Temp (!) 94 F (34.4 C) (Rectal)   Resp (!) 36   SpO2 100%         SpO2: SpO2: 100 % O2 Device:SpO2: 100 %  O2 Flow Rate: .   Palliative Assessment/Data: 10%     MDM: High   Lacey Lukin Jeni Salles, PA-C  Palliative Medicine Team Team phone # [phone]  Thank you for allowing the Palliative Medicine Team to assist in the care of this patient. Please utilize secure chat with additional questions, if there is  no response within 30 minutes please call the above phone number.  Palliative Medicine Team providers are available by phone from 7am to 7pm daily and can be reached through the team cell phone.  Should this patient require assistance outside of these hours, please call the patient's attending physician.

## 2022-05-10 NOTE — Consult Note (Addendum)
ER Consult Note   Patient: Lacey Rogers KXF:[id number] DOB: 1926/08/05 DOA: 2022-05-20 DOS: the patient was seen and examined on 05/20/2022 PCP: Georgann Housekeeper, MD  Patient coming from: [facility]; NOK: Niece, Gailen Shelter, [phone]   Chief Complaint: [place] Setting  HPI: Lacey Rogers is a 88 y.o. female with medical history significant of dementia presenting in extremis.  Patient with hypothermia, marked respiratory distress on NRB O2, unresponsive.  Family wasn't able to be reached and the Ethics Committee recommended use of the Futility Policy.  Palliative care was consulted and the patient expired shortly after my evaluation.     ER Course:  DNR since 2016 at least, no family available, patient is dying likely from sepsis due to PNA.     Review of Systems: unable to review all systems due to the inability of the patient to answer questions. Past Medical History:  Diagnosis Date   Dementia, senile, with behavioral disturbance (HCC)    History reviewed. No pertinent surgical history. Social History:  reports that she has been smoking. She has never used smokeless tobacco. She reports current alcohol use. No history on file for drug use.  No Known Allergies  History reviewed. No pertinent family history.  Prior to Admission medications   Not on File    Physical Exam: Vitals:   May 20, 2022 1235 05/20/22 1318 05/20/2022 1325 May 20, 2022 1406  BP:  (!) 71/61    Pulse:  (!) 36 82   Resp:  (!) 34 (!) 36   Temp: (!) 94 F (34.4 C)     TempSrc: Rectal     SpO2:   100%   Weight:    54.4 kg  Height:    5\' 2"  (1.575 m)   General:  Appears critically ill Eyes:   normal lids, eyes closed throughout ENT:  grossly normal lips, NRB in place Neck:  no LAD, masses or thyromegaly Cardiovascular:  RRR, no m/r/g. No LE edema.  Respiratory:   Diffuse coarse rhonchi.  Significantly increased respiratory effort. Abdomen:  soft, NT, ND Skin:  no rash or induration seen on  limited exam Musculoskeletal: no edema, no bony abnormality Psychiatric:  obtunded, unresponsive Neurologic:  unable to perform   Radiological Exams on Admission: Independently reviewed - see discussion in A/P where applicable  CT Head Wo Contrast  Result Date: May 20, 2022 CLINICAL DATA:  Altered mental status EXAM: CT HEAD WITHOUT CONTRAST TECHNIQUE: Contiguous axial images were obtained from the base of the skull through the vertex without intravenous contrast. RADIATION DOSE REDUCTION: This exam was performed according to the departmental dose-optimization program which includes automated exposure control, adjustment of the mA and/or kV according to patient size and/or use of iterative reconstruction technique. COMPARISON:  11/07/2021 FINDINGS: Brain: No evidence of intracranial hemorrhage, acute infarction, hydrocephalus, extra-axial collection, or mass lesion/mass effect. Moderate diffuse cerebral and cerebellar atrophy and mild chronic small vessel disease show no significant change. Vascular:  No hyperdense vessel or other acute findings. Skull: No evidence of fracture or other significant bone abnormality. Sinuses/Orbits:  No acute findings. Other: None. IMPRESSION: No acute intracranial abnormality. Stable atrophy and chronic small vessel disease. Electronically Signed   By: 11/09/2021 M.D.   On: 05-20-22 13:24   DG Chest 1 View  Result Date: 20-May-2022 CLINICAL DATA:  A 88 year old presents with shortness of breath. EXAM: CHEST  1 VIEW COMPARISON:  March of 2016 FINDINGS: EKG leads project over the chest. Cardiomediastinal contours are stable. Hilar structures with increased fullness since  previous imaging bilaterally. Indistinct interstitium. No lobar consolidation.  No sign of pneumothorax. On limited assessment there is no acute skeletal finding. IMPRESSION: Increased fullness of bilateral hila is suggesting central pulmonary vascular congestion perhaps associated with mild edema.  Suggest follow-up PA and lateral chest radiograph when the patient is able to ensure resolution of hilar fullness. No sign of lobar consolidation. Electronically Signed   By: Donzetta Kohut M.D.   On: 06-04-22 13:02    EKG: Independently reviewed.  NSR with rate 91; nonspecific ST changes   Labs on Admission: I have personally reviewed the available labs and imaging studies at the time of the admission.  Pertinent labs:    CMP pending Lactate 6.8 WBC 10.3   Assessment and Plan: Principal Problem:   Septic shock due to undetermined organism [facility]) Active Problems:   Dementia (HCC)    Septic shock -Patient presenting in septic shock, likely respiratory source -She was hypothermic and in respiratory distress -She had 2 bedside DNR forms, the older from 2016 -No family available -Ethics Committee was consulted and recommended use of the Futility Policy -The patient appeared to be actively dying at the time of my evaluation and comfor measures were appropriate -She died shortly after my evaluation   Patient suffered an in-ER demise prior to admission. Cause of death appears to be septic shock from an undetermined organism, likely respiratory in nature and possibly associated with aspiration PNA.  Author: Jonah Blue, MD 06-04-22 2:26 PM  For on call review [email or web address].

## 2022-05-10 NOTE — Ethics Note (Signed)
Ethics Consult Note  Initial contact w/ medical team 05-13-22, which was on patient's hospital day 0   Source of Consult: Dr Lockie Mola  Current attending physician/service: Virgina Norfolk, DO (Emergency medicine)   Reason(s) for consult / relevant ethical question(s):  Decision making in absence of available NOK/Surrogate    Information-gathering: Discussion by phone w/ ED, Dr Lockie Mola and w/ Palliative, Richardson Dopp PA-C Chart review 2022/05/13   Narrative:  Medical facts: Patient here from facility, unresponsive.  History of dementia, schizophrenia. She is hypothermic, blood pressure in the 90s, she is tachypneic, suspected sepsis / aspiration pneumonia. ED is working up for sepsis.  Patient's Personal/Social Facts: From facility, reportedly at baseline is alert and can track. Documented DNR in chart though no DNR or MOST form available, no advanced directive. There is a relative listed as contact but ED has been unable to reach them as has the facility, reportedly for past few weeks.    Relevant underlying ethical principles were discussed directly with Dr Lockie Mola (attending physician). Other resources were discussed directly with the that person, if needed. Ethics committee strives to ensure that all necessary and appropriate steps are taken by the medical team such that all decisions made for this patient are ethically appropriate. Please note that Ethics does NOT offer legal counsel. Ethics offers the following recommendations:     Recommendations:  SURROGATE DECISION MAKING IN PATIENT WITHOUT CAPACITY / PATIENT AUTONOMY:  if NOK/other proxy not reasonably available and no one else to speak on behalf of this patient, with documented DNR would certainly not treat with CPR/intubation (respect for patient autonomy).  The attending physician may act as decision-maker (in cooperation with another consulting physician) based on the [place] statue below which requires collaboration with  another physician. Recommend consult with hospitalist physician. It is also appropriate to involve palliative care at this juncture, though per statue a second physician must collaborate with the attending physician when medical team is making decisions.  See: [facility] Statutes Chapter 90. Medicine and Allied Occupations  90-21.13. Informed consent to health care treatment or procedure. In brief summary, this statute outlines that the following persons, in order indicated, are authorized to consent to medical treatment on behalf of the patient who does not demonstrate capacity to do so: Legally assigned guardian appointed by the court > healthcare agent appointed by legal healthcare power of attorney > healthcare agent appointed by the patient > legal spouse > majority of the patient's reasonably available parents and children who are at least 67 years of age > individual who has an established relationship with the patient, who is acting in good faith on behalf of the patient, and who can reliably convey the patient's wishes > attending physician with confirmation by another physician of the patient's condition and the necessity for treatment (unless in urgent/emergent situation)  Would be reasonable to evaluate fully and treat aggressively while making further attempts to reach [facility]. Alternatively, if clinically there is concern that any given treatment is medically futile then a physician is not ethically obligated to offer/provide that treatment - as below, if this is a decision being made unilaterally by the medical team, a second physician's opinion is necessary to confirm or refute futility.     Thank you for this consult. Ethics will continue to follow this case.   Dr Lockie Mola and Josseline have my personal cell phone number and my permission to share this number at their discretion.  Secure message on Epic is also welcome but  may not receive an immediate response.  Please reference  AMION for on-call committee member if needed.    Sunnie Nielsen [facility] Ethics Committee

## 2022-05-10 NOTE — ED Notes (Signed)
Unable to obtain Spo2. MD made aware. Bair hugger applied.

## 2022-05-10 NOTE — TOC Initial Note (Addendum)
Transition of Care [facility]) - Initial/Assessment Note    Patient Details  Name: Lacey Rogers MRN: [id number] Date of Birth: 10/16/1925  Transition of Care [facility]) CM/SW Contact:    Lockie Pares, RN Phone Number: 05/19/2022, 12:49 PM  Clinical Narrative:                  Patient presented to the ED obtunded from [facility]. She has been decreasing LOC and increasing in respiratory distress. She is aDNR. Her only contact listed is Ms. Manson Passey who has been unreachable for the Nursing facility. They have started procedures to obtain guardianship.  Conferred with providers, placed order for palliative care, and ethics committee to address aggressive measure versus comfort measures given age and history.  1340 stayed with patient, held hand, patient listening to music.  Respirations decreaseing.  1400 cease with respirations and asystole on monitor. Provider/RN notified. AC called, however does not have mailbox set up.  [facility] called regarding expiration.   Expected Discharge Plan: Hospice Medical Facility Barriers to Discharge: Continued Medical Work up   Patient Goals and CMS Choice        Expected Discharge Plan and Services Expected Discharge Plan: Hospice Medical Facility   Discharge Planning Services: CM Consult Post Acute Care Choice: Nursing Home                                        Prior Living Arrangements/Services   Lives with:: Facility Resident Patient language and need for interpreter reviewed:: Yes        Need for Family Participation in Patient Care: Yes (Comment) Care giver support system in place?: No (comment)   Criminal Activity/Legal Involvement Pertinent to Current Situation/Hospitalization: No - Comment as needed  Activities of Daily Living      Permission Sought/Granted                  Emotional Assessment       Orientation: :  (Unresponsive) Alcohol / Substance Use: Not Applicable Psych Involvement: No  (comment)  Admission diagnosis:  FTT Patient Active Problem List   Diagnosis Date Noted   Dementia (HCC) 12/30/2014   PCP:  Georgann Housekeeper, MD Pharmacy:  No Pharmacies Listed    Social Determinants of Health (SDOH) Interventions    Readmission Risk Interventions     No data to display

## 2022-05-10 NOTE — ED Provider Notes (Addendum)
Shared visit.  Patient here from facility.  History of dementia, schizophrenia.  Not really on a lot of medications.  Sounds like she has not been doing well over the last several weeks.  Normally she is alert and can track.  She was found today more unresponsive.  She arrives fairly obtunded.  She has a DNR.  We are unable to get in touch with power of attorney/next of kin.  She is hypothermic, blood pressure in the 90s, she is tachypneic.  She is got diminished breath sounds throughout.  Suspicion is for sepsis and infectious process.  Suspect aspiration pneumonia.  We will pursue sepsis work-up including blood cultures, CBC, COVID test, chest x-ray, urinalysis.  We will get a head CT.  My suspicion that the best thing for her is likely comfort care but unable to initiate that at this time as would like to try to talk with power of attorney.  Anticipate admission for further care but will talk with transitions of care team to see if we can locate someone who can help make some medical decisions for the patient.    After talking with social work/case management team they reach out to facility and overall they have been having a hard time getting in touch with the power of attorney/niece.  They have not been able to return in several weeks.  Multiple attempts were made here with out any success.  I talked with Dr. Lyn Hollingshead with the ethics team and overall she recommends using futility policy.  I have consulted palliative care.  Richardson Dopp PA came to evaluate the patient.  Ultimately, she appears to be in respiratory distress, hypothermic, intermittently hypotensive.  I suspect that she is septic possibly from urinary source or respiratory source.  She is very altered does not follow commands.  Using the futility policy, consulted Dr. Ophelia Charter with the hospitalist team who came down and evaluated the patient with me.  We both ultimately using this policy think that comfort measures are the most reasonable  route of treatment for this patient.  Thus far lab work shows no significant leukocytosis or anemia.  Head CT per my review and interpretation shows no acute findings.  May be some edema on her chest x-ray.  And ultimately suspect respiratory process causing her illness and any aggressive care would be futile.  Utimately at this time after talking with Dr. Ophelia Charter and other medical team will pursue comfort care. Comfort measure have been started with help of palliative care team.  During this time patient did appear to stop breathing in and on my evaluation she has no respiratory effort, no pulses, asystole on the monitor.  Patient pronounced deceased at 1400.  Would not be a medical examiner case.  Primary care doctor Dr. Georgann Housekeeper to be made aware.  This chart was dictated using voice recognition software.  Despite best efforts to proofread,  errors can occur which can change the documentation meaning.     Virgina Norfolk, DO May 13, 2022 1227    Virgina Norfolk, DO 2022-05-13 1357    Virgina Norfolk, DO 2022-05-13 1404

## 2022-05-10 DEATH — deceased

## 2022-05-13 LAB — CULTURE, BLOOD (ROUTINE X 2)
Culture: NO GROWTH
Culture: NO GROWTH

## 2022-09-18 IMAGING — CT CT HEAD W/O CM
3 series · 15 of 47 positions shown, 18 images · non-contrast
Comparison: 10/31/2021

CLINICAL DATA: Headaches, initial encounter



[Series 2: head wo · axial · 0.44mm/px · z∈[-89,+46]mm · 9 of 33 slices shown, 12 images]
[im 3/33  brain]
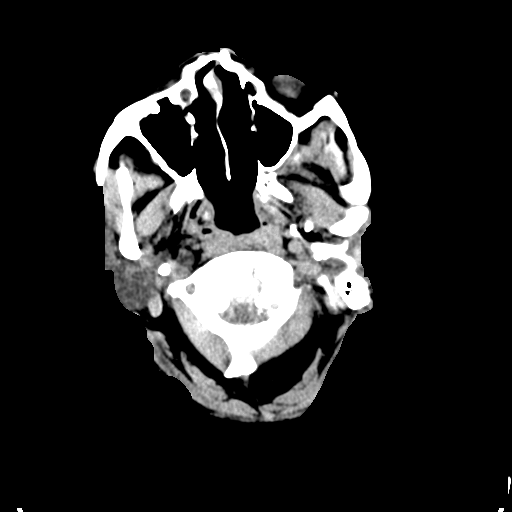
[im 3/33  bone]
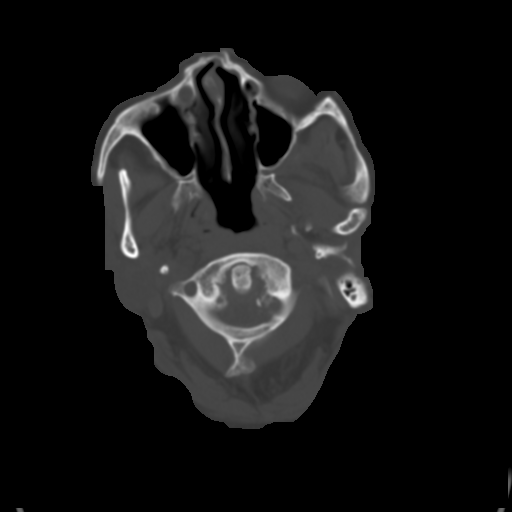
[im 6/33  brain]
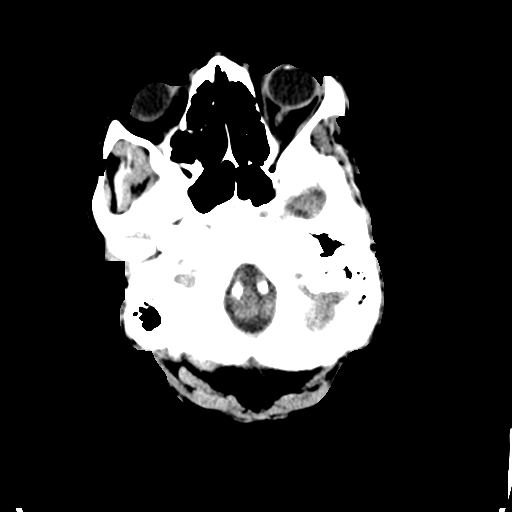
[im 9/33  brain]
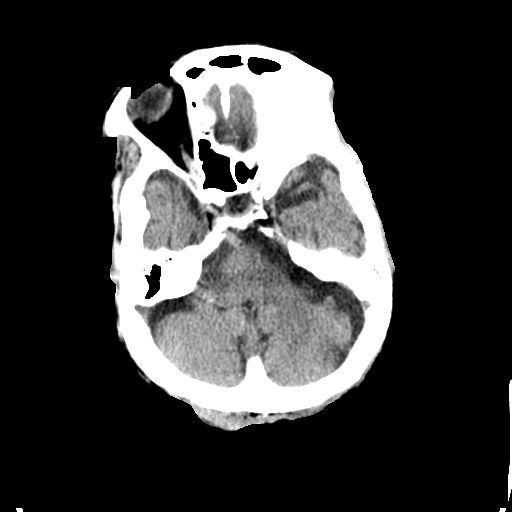
[im 13/33  brain]
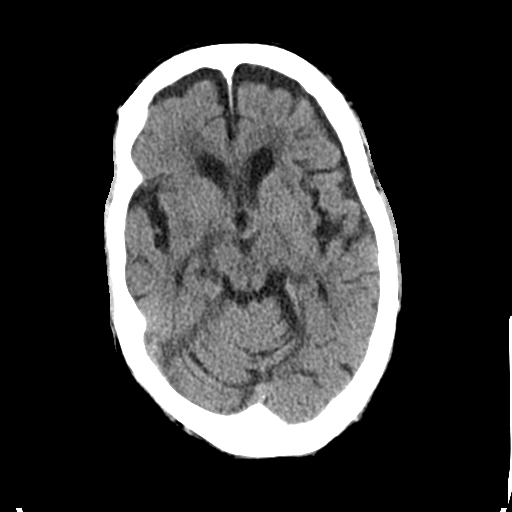
[im 17/33  brain]
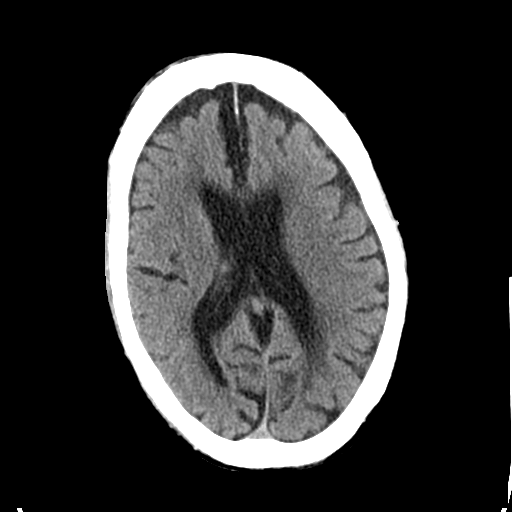
[im 17/33  bone]
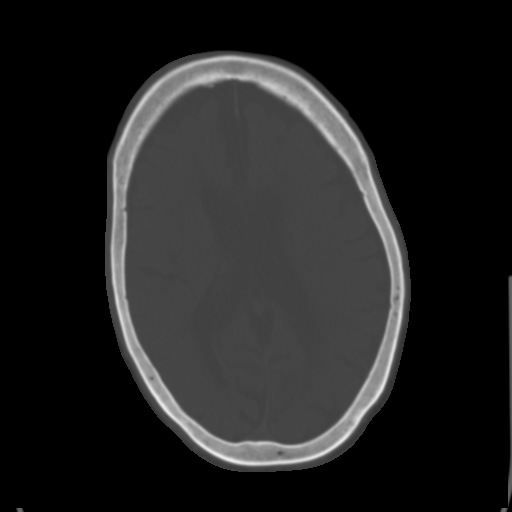
[im 20/33  brain]
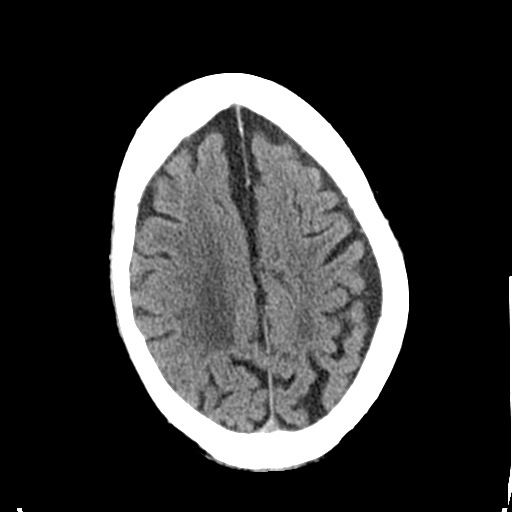
[im 24/33  brain]
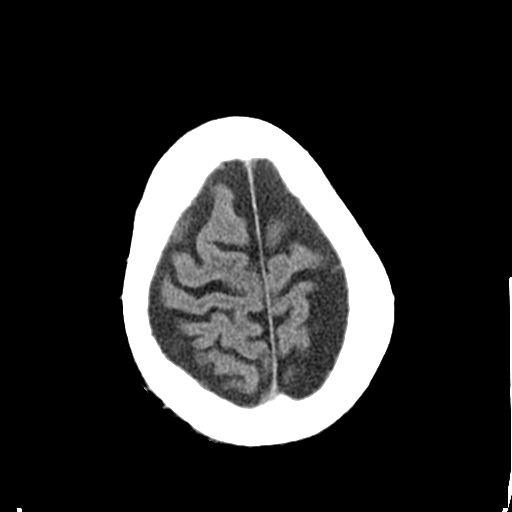
[im 27/33  brain]
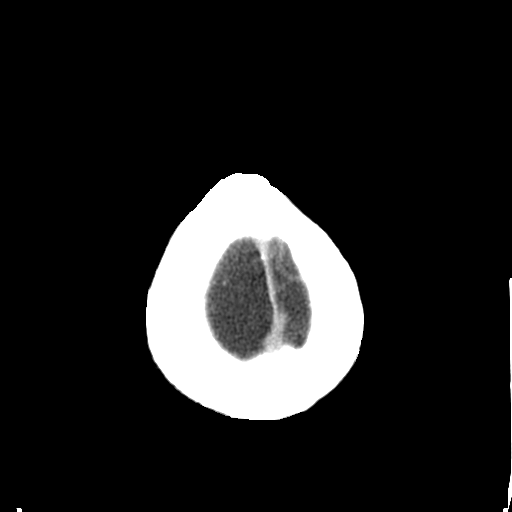
[im 30/33  brain]
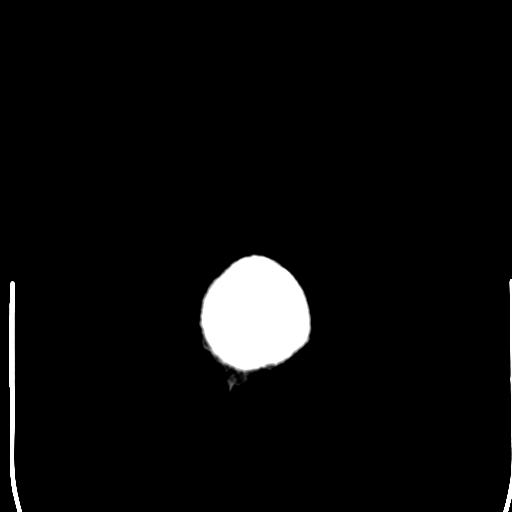
[im 30/33  bone]
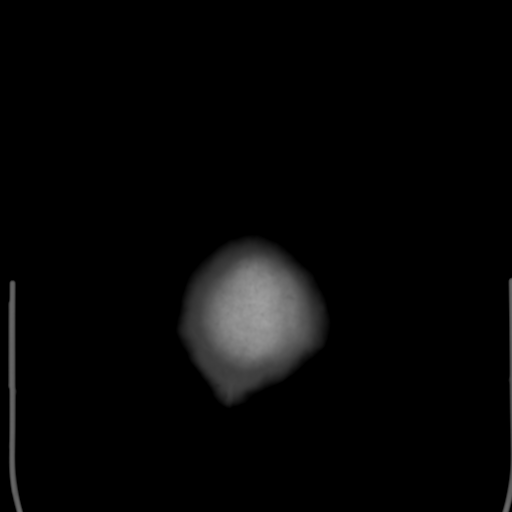

[Series 4: coronal soft tissue · coronal · 0.32mm/px · 3 of 70 slices shown]
[im 24/70  brain]
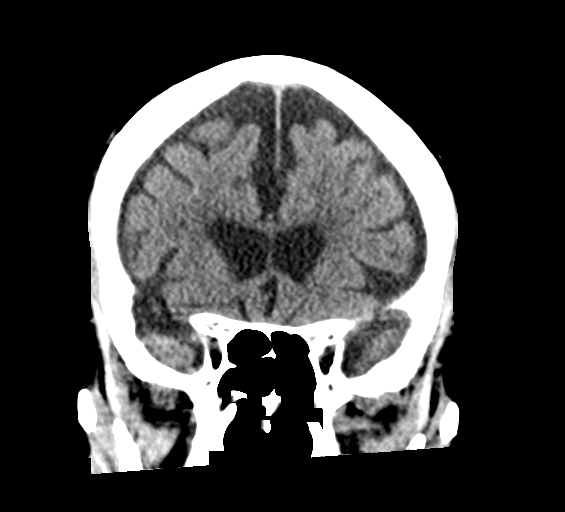
[im 31/70  brain]
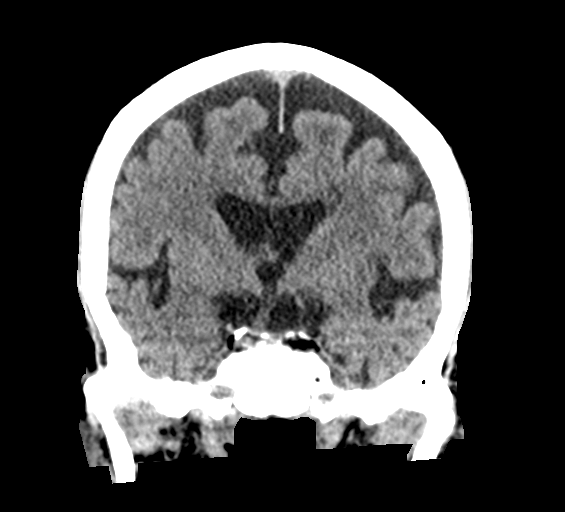
[im 39/70  brain]
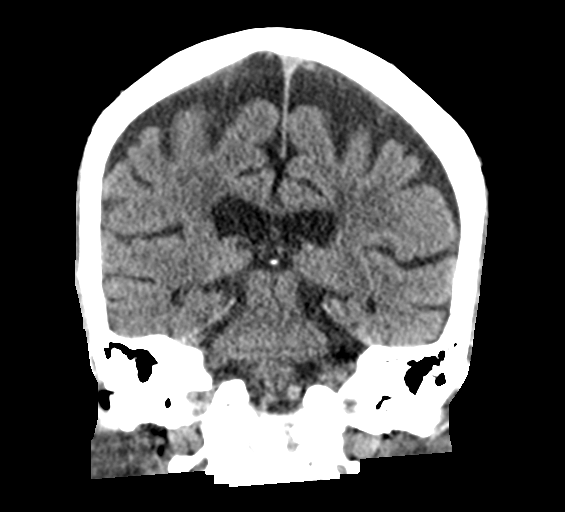

[Series 5: sagittal soft tissue · sagittal · 0.32mm/px · 3 of 60 slices shown]
[im 20/60  brain]
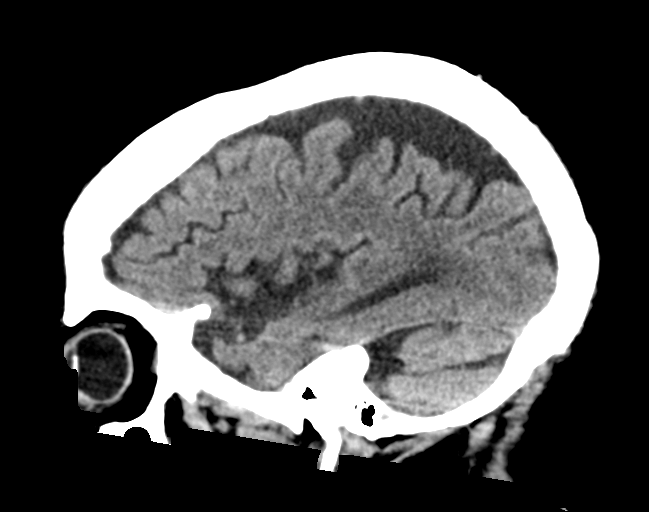
[im 30/60  brain]
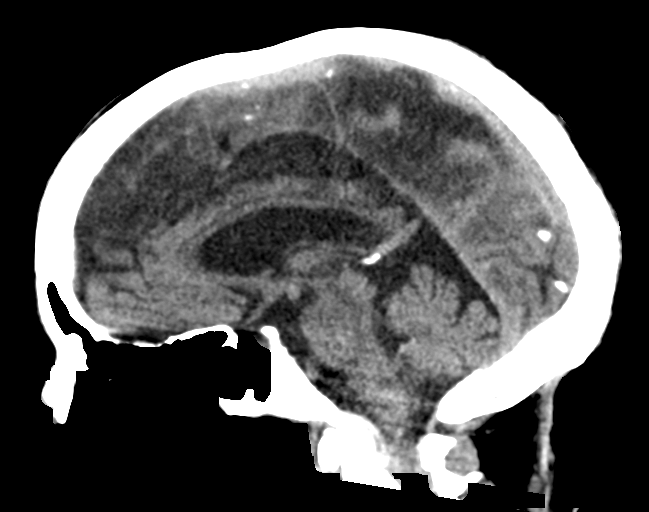
[im 40/60  brain]
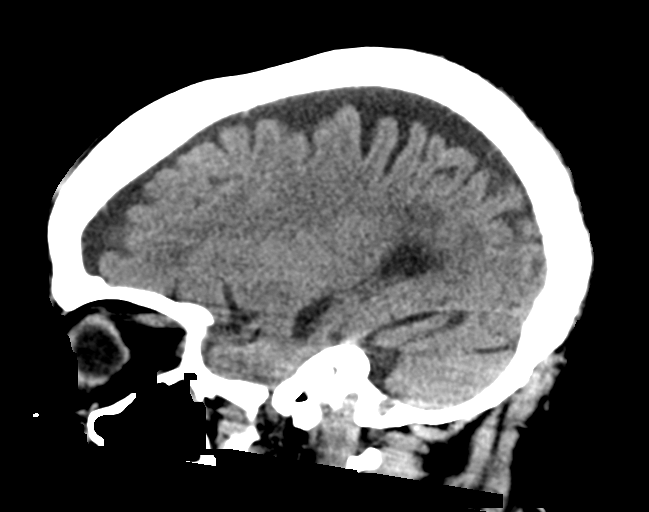

[15 of 47 positions shown; findings below may reference images not displayed]

FINDINGS: Brain: No evidence of acute infarction, hemorrhage, hydrocephalus,
extra-axial collection or mass lesion/mass effect. Chronic atrophic
and ischemic changes are again noted and stable.

Vascular: No hyperdense vessel or unexpected calcification.

Skull: Normal. Negative for fracture or focal lesion.

Sinuses/Orbits: No acute finding.

Other: None.
IMPRESSION: Chronic atrophic and ischemic changes stable from the prior exam.
